# Patient Record
Sex: Male | Born: 1985 | Race: White | Hispanic: No | Marital: Single | Smoking: Never smoker
Health system: Southern US, Community
[De-identification: ages and names within clinical notes are randomized; demographics above are authoritative.]

## PROBLEM LIST (undated history)

## (undated) DIAGNOSIS — Z789 Other specified health status: Secondary | ICD-10-CM

## (undated) HISTORY — DX: Other specified health status: Z78.9

---
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
## 2020-02-25 ENCOUNTER — Other Ambulatory Visit: Payer: Self-pay

## 2020-02-25 ENCOUNTER — Emergency Department (HOSPITAL_BASED_OUTPATIENT_CLINIC_OR_DEPARTMENT_OTHER)
Admission: EM | Admit: 2020-02-25 | Discharge: 2020-02-25 | Disposition: A | Discharge status: Home / Self Care | Payer: 59 | Attending: Emergency Medicine | Admitting: Emergency Medicine

## 2020-02-25 ENCOUNTER — Encounter (HOSPITAL_BASED_OUTPATIENT_CLINIC_OR_DEPARTMENT_OTHER): Payer: Self-pay | Admitting: Emergency Medicine

## 2020-02-25 DIAGNOSIS — U071 COVID-19: Secondary | ICD-10-CM | POA: Insufficient documentation

## 2020-02-25 DIAGNOSIS — R1013 Epigastric pain: Secondary | ICD-10-CM | POA: Insufficient documentation

## 2020-02-25 LAB — COMPREHENSIVE METABOLIC PANEL
ALT: 32 U/L (ref 0–44)
AST: 34 U/L (ref 15–41)
Albumin: 4 g/dL (ref 3.5–5.0)
Alkaline Phosphatase: 52 U/L (ref 38–126)
Anion gap: 10 (ref 5–15)
BUN: 19 mg/dL (ref 6–20)
CO2: 25 mmol/L (ref 22–32)
Calcium: 8.6 mg/dL — ABNORMAL LOW (ref 8.9–10.3)
Chloride: 102 mmol/L (ref 98–111)
Creatinine, Ser: 1.27 mg/dL — ABNORMAL HIGH (ref 0.61–1.24)
GFR calc Af Amer: >60 mL/min (ref >60)
GFR calc non Af Amer: >60 mL/min (ref >60)
Glucose, Bld: 90 mg/dL (ref 70–99)
Potassium: 4.2 mmol/L (ref 3.5–5.1)
Sodium: 137 mmol/L (ref 135–145)
Total Bilirubin: 0.6 mg/dL (ref 0.3–1.2)
Total Protein: 6.8 g/dL (ref 6.5–8.1)

## 2020-02-25 LAB — CBC
HCT: 40.5 % (ref 39.0–52.0)
Hemoglobin: 13.5 g/dL (ref 13.0–17.0)
MCH: 30.1 pg (ref 26.0–34.0)
MCHC: 33.3 g/dL (ref 30.0–36.0)
MCV: 90.4 fL (ref 80.0–100.0)
Platelets: 94 10*3/uL — ABNORMAL LOW (ref 150–400)
RBC: 4.48 MIL/uL (ref 4.22–5.81)
RDW: 11.8 % (ref 11.5–15.5)
WBC: 3.1 10*3/uL — ABNORMAL LOW (ref 4.0–10.5)
nRBC: 0 % (ref 0.0–0.2)

## 2020-02-25 LAB — URINALYSIS, ROUTINE W REFLEX MICROSCOPIC
Bilirubin Urine: NEGATIVE
Glucose, UA: NEGATIVE mg/dL
Hgb urine dipstick: NEGATIVE
Ketones, ur: NEGATIVE mg/dL
Leukocytes,Ua: NEGATIVE
Nitrite: NEGATIVE
Protein, ur: NEGATIVE mg/dL
Specific Gravity, Urine: 1.025 (ref 1.005–1.030)
pH: 6 (ref 5.0–8.0)

## 2020-02-25 LAB — LIPASE, BLOOD: Lipase: 30 U/L (ref 11–51)

## 2020-02-25 MED ORDER — BENZONATATE 100 MG PO CAPS: 100.0000 mg | ORAL_CAPSULE | Freq: Three times a day (TID) | ORAL | 0 refills | Status: DC | PRN

## 2020-02-25 MED ORDER — ONDANSETRON 4 MG PO TBDP
8.0000 mg | ORAL_TABLET | Freq: Once | ORAL | Status: AC
  Administered 2020-02-25: 8 mg via ORAL
  Filled 2020-02-25: qty 2

## 2020-02-25 MED ORDER — ONDANSETRON 8 MG PO TBDP: 8.0000 mg | ORAL_TABLET | Freq: Three times a day (TID) | ORAL | 0 refills | Status: DC | PRN

## 2020-02-25 NOTE — ED Triage Notes (Signed)
Pt states he tested positive for Covid last Tuesday  Pt states since then he feel better one day and then sick again the next  Pt states he had vomiting this morning

## 2020-02-25 NOTE — Discharge Instructions (Addendum)
Take Tylenol as needed for aches and pains.  Take Zofran as needed for nausea.  Tessalon is to help you with coughing.  Monitor for shortness of breath persistent high fevers or other concerning symptoms

## 2020-02-25 NOTE — ED Provider Notes (Signed)
[facility] EMERGENCY DEPARTMENT Provider Note   CSN: [id number] Arrival date & time: 02/25/20  5621     History Chief Complaint  Patient presents with  . Covid Positive    Cameron Cox is a 34 y.o. male.  HPI   Patient states he was diagnosed with Covid last Thursday.  Since then he has been feeling sick.  No high fevers.  Patient has not been feeling short of breath.  Patient has not been vaccinated.  This morning the patient started having pain in his epigastric region.  He did have one episode of nausea and vomiting.  No diarrhea. History reviewed. No pertinent past medical history.  There are no problems to display for this patient.   History reviewed. No pertinent surgical history.     Family History  Problem Relation Age of Onset  . Hypertension Mother     Social History   Tobacco Use  . Smoking status: Never Smoker  . Smokeless tobacco: Never Used  Vaping Use  . Vaping Use: Never used  Substance Use Topics  . Alcohol use: Not Currently  . Drug use: Never    Home Medications Prior to Admission medications   Medication Sig Start Date End Date Taking? Authorizing Provider  benzonatate (TESSALON) 100 MG capsule Take 1 capsule (100 mg total) by mouth 3 (three) times daily as needed for cough. 02/25/20   Linwood Dibbles, MD  ondansetron (ZOFRAN ODT) 8 MG disintegrating tablet Take 1 tablet (8 mg total) by mouth every 8 (eight) hours as needed for nausea or vomiting. 02/25/20   Linwood Dibbles, MD    Allergies    Patient has no known allergies.  Review of Systems   Review of Systems  All other systems reviewed and are negative.   Physical Exam Updated Vital Signs BP 117/73 (BP Location: Right Arm)   Pulse 88   Temp 98.5 F (36.9 C) (Oral)   Resp 16   Ht 1.702 m (5\' 7" )   Wt 78.9 kg   SpO2 98%   BMI 27.25 kg/m   Physical Exam Vitals and nursing note reviewed.  Constitutional:      General: He is not in acute distress.    Appearance: He is  well-developed.  HENT:     Head: Normocephalic and atraumatic.     Right Ear: External ear normal.     Left Ear: External ear normal.  Eyes:     General: No scleral icterus.       Right eye: No discharge.        Left eye: No discharge.     Conjunctiva/sclera: Conjunctivae normal.  Neck:     Trachea: No tracheal deviation.  Cardiovascular:     Rate and Rhythm: Normal rate and regular rhythm.  Pulmonary:     Effort: Pulmonary effort is normal. No respiratory distress.     Breath sounds: Normal breath sounds. No stridor. No wheezing or rales.  Abdominal:     General: Bowel sounds are normal. There is no distension.     Palpations: Abdomen is soft.     Tenderness: There is abdominal tenderness in the epigastric area. There is no guarding or rebound.     Hernia: No hernia is present.  Musculoskeletal:        General: No tenderness.     Cervical back: Neck supple.  Skin:    General: Skin is warm and dry.     Findings: No rash.  Neurological:  Mental Status: He is alert.     Cranial Nerves: No cranial nerve deficit (no facial droop, extraocular movements intact, no slurred speech).     Sensory: No sensory deficit.     Motor: No abnormal muscle tone or seizure activity.     Coordination: Coordination normal.     ED Results / Procedures / Treatments   Labs (all labs ordered are listed, but only abnormal results are displayed) Labs Reviewed  CBC - Abnormal; Notable for the following components:      Result Value   WBC 3.1 (*)    Platelets 94 (*)    All other components within normal limits  COMPREHENSIVE METABOLIC PANEL - Abnormal; Notable for the following components:   Creatinine, Ser 1.27 (*)    Calcium 8.6 (*)    All other components within normal limits  LIPASE, BLOOD  URINALYSIS, ROUTINE W REFLEX MICROSCOPIC    EKG None  Radiology No results found.  Procedures Procedures (including critical care time)  Medications Ordered in ED Medications  ondansetron  (ZOFRAN-ODT) disintegrating tablet 8 mg (8 mg Oral Given 02/25/20 0809)    ED Course  I have reviewed the triage vital signs and the nursing notes.  Pertinent labs & imaging results that were available during my care of the patient were reviewed by me and considered in my medical decision making (see chart for details).  Clinical Course as of Feb 25 931  Tue Feb 25, 2020  0917 Laboratory tests normal   [JK]    Clinical Course User Index [JK] Linwood Dibbles, MD   MDM Rules/Calculators/A&P                          Patient presented to the ED for evaluation of abdominal pain.  Patient has known Covid infection.  Exam is reassuring.  Laboratory tests are unremarkable.  Doubt symptoms are related to pancreatitis cholecystitis bowel obstruction or other emergent etiology.  I suspect his symptoms are related to Covid.  Patient does not have an oxygen requirement.  His vitals are so normal.  No Covid risk factors.  Patient is stable for discharge.  Warning signs precautions discussed. Final Clinical Impression(s) / ED Diagnoses Final diagnoses:  COVID-19 virus infection  Epigastric pain    Rx / DC Orders ED Discharge Orders         Ordered    ondansetron (ZOFRAN ODT) 8 MG disintegrating tablet  Every 8 hours PRN        02/25/20 0931    benzonatate (TESSALON) 100 MG capsule  3 times daily PRN        02/25/20 0931           Linwood Dibbles, MD 02/25/20 [id number]

## 2021-02-23 ENCOUNTER — Ambulatory Visit (HOSPITAL_BASED_OUTPATIENT_CLINIC_OR_DEPARTMENT_OTHER)
Admission: EM | Admit: 2021-02-23 | Discharge: 2021-02-23 | Disposition: A | Discharge status: Home / Self Care | Payer: 59 | Attending: Surgery | Admitting: Surgery

## 2021-02-23 ENCOUNTER — Emergency Department (HOSPITAL_BASED_OUTPATIENT_CLINIC_OR_DEPARTMENT_OTHER): Payer: 59

## 2021-02-23 ENCOUNTER — Encounter (HOSPITAL_BASED_OUTPATIENT_CLINIC_OR_DEPARTMENT_OTHER): Payer: Self-pay | Admitting: Emergency Medicine

## 2021-02-23 ENCOUNTER — Encounter (HOSPITAL_COMMUNITY)
Admission: EM | Disposition: A | Discharge status: Home / Self Care | Payer: Self-pay | Source: Home / Self Care | Attending: Emergency Medicine

## 2021-02-23 ENCOUNTER — Other Ambulatory Visit: Payer: Self-pay

## 2021-02-23 ENCOUNTER — Emergency Department (HOSPITAL_COMMUNITY): Payer: 59 | Admitting: Anesthesiology

## 2021-02-23 DIAGNOSIS — K353 Acute appendicitis with localized peritonitis, without perforation or gangrene: Secondary | ICD-10-CM | POA: Insufficient documentation

## 2021-02-23 DIAGNOSIS — Z20822 Contact with and (suspected) exposure to covid-19: Secondary | ICD-10-CM | POA: Diagnosis not present

## 2021-02-23 HISTORY — PX: LAPAROSCOPIC APPENDECTOMY: SHX408

## 2021-02-23 LAB — COMPREHENSIVE METABOLIC PANEL
ALT: 22 U/L (ref 0–44)
AST: 21 U/L (ref 15–41)
Albumin: 4.1 g/dL (ref 3.5–5.0)
Alkaline Phosphatase: 68 U/L (ref 38–126)
Anion gap: 7 (ref 5–15)
BUN: 23 mg/dL — ABNORMAL HIGH (ref 6–20)
CO2: 27 mmol/L (ref 22–32)
Calcium: 8.9 mg/dL (ref 8.9–10.3)
Chloride: 105 mmol/L (ref 98–111)
Creatinine, Ser: 1.19 mg/dL (ref 0.61–1.24)
GFR, Estimated: >60 mL/min (ref >60)
Glucose, Bld: 91 mg/dL (ref 70–99)
Potassium: 3.9 mmol/L (ref 3.5–5.1)
Sodium: 139 mmol/L (ref 135–145)
Total Bilirubin: 0.5 mg/dL (ref 0.3–1.2)
Total Protein: 7.6 g/dL (ref 6.5–8.1)

## 2021-02-23 LAB — LIPASE, BLOOD: Lipase: 23 U/L (ref 11–51)

## 2021-02-23 LAB — CBC WITH DIFFERENTIAL/PLATELET
Abs Immature Granulocytes: 0.01 10*3/uL (ref 0.00–0.07)
Basophils Absolute: 0 10*3/uL (ref 0.0–0.1)
Basophils Relative: 0 %
Eosinophils Absolute: 0.1 10*3/uL (ref 0.0–0.5)
Eosinophils Relative: 2 %
HCT: 40 % (ref 39.0–52.0)
Hemoglobin: 13.3 g/dL (ref 13.0–17.0)
Immature Granulocytes: 0 %
Lymphocytes Relative: 31 %
Lymphs Abs: 1.7 10*3/uL (ref 0.7–4.0)
MCH: 30.2 pg (ref 26.0–34.0)
MCHC: 33.3 g/dL (ref 30.0–36.0)
MCV: 90.7 fL (ref 80.0–100.0)
Monocytes Absolute: 0.5 10*3/uL (ref 0.1–1.0)
Monocytes Relative: 10 %
Neutro Abs: 3.3 10*3/uL (ref 1.7–7.7)
Neutrophils Relative %: 57 %
Platelets: 182 10*3/uL (ref 150–400)
RBC: 4.41 MIL/uL (ref 4.22–5.81)
RDW: 12.1 % (ref 11.5–15.5)
WBC: 5.7 10*3/uL (ref 4.0–10.5)
nRBC: 0 % (ref 0.0–0.2)

## 2021-02-23 LAB — URINALYSIS, ROUTINE W REFLEX MICROSCOPIC
Bilirubin Urine: NEGATIVE
Glucose, UA: NEGATIVE mg/dL
Ketones, ur: NEGATIVE mg/dL
Leukocytes,Ua: NEGATIVE
Nitrite: NEGATIVE
Protein, ur: NEGATIVE mg/dL
Specific Gravity, Urine: 1.03 (ref 1.005–1.030)
pH: 6 (ref 5.0–8.0)

## 2021-02-23 LAB — URINALYSIS, MICROSCOPIC (REFLEX)

## 2021-02-23 LAB — RESP PANEL BY RT-PCR (FLU A&B, COVID) ARPGX2
Influenza A by PCR: NEGATIVE
Influenza B by PCR: NEGATIVE
SARS Coronavirus 2 by RT PCR: NEGATIVE

## 2021-02-23 LAB — CK: Total CK: 150 U/L (ref 49–397)

## 2021-02-23 SURGERY — APPENDECTOMY, LAPAROSCOPIC
Anesthesia: General | Site: Abdomen

## 2021-02-23 MED ORDER — PROPOFOL 10 MG/ML IV BOLUS
INTRAVENOUS | Status: DC | PRN
  Administered 2021-02-23: 200 mg via INTRAVENOUS

## 2021-02-23 MED ORDER — ONDANSETRON HCL 4 MG/2ML IJ SOLN
INTRAMUSCULAR | Status: DC | PRN
  Administered 2021-02-23: 4 mg via INTRAVENOUS

## 2021-02-23 MED ORDER — IOHEXOL 350 MG/ML SOLN
100.0000 mL | Freq: Once | INTRAVENOUS | Status: AC | PRN
  Administered 2021-02-23: 85 mL via INTRAVENOUS

## 2021-02-23 MED ORDER — ACETAMINOPHEN 10 MG/ML IV SOLN: 1000.0000 mg | Freq: Once | INTRAVENOUS | Status: DC | PRN

## 2021-02-23 MED ORDER — ONDANSETRON HCL 4 MG/2ML IJ SOLN
INTRAMUSCULAR | Status: AC
  Filled 2021-02-23: qty 4

## 2021-02-23 MED ORDER — METRONIDAZOLE 500 MG/100ML IV SOLN
500.0000 mg | Freq: Once | INTRAVENOUS | Status: AC
  Administered 2021-02-23: 500 mg via INTRAVENOUS
  Filled 2021-02-23: qty 100

## 2021-02-23 MED ORDER — ROCURONIUM BROMIDE 10 MG/ML (PF) SYRINGE
PREFILLED_SYRINGE | INTRAVENOUS | Status: DC | PRN
  Administered 2021-02-23: 50 mg via INTRAVENOUS

## 2021-02-23 MED ORDER — MIDAZOLAM HCL 2 MG/2ML IJ SOLN
INTRAMUSCULAR | Status: AC
  Filled 2021-02-23: qty 2

## 2021-02-23 MED ORDER — SUGAMMADEX SODIUM 200 MG/2ML IV SOLN
INTRAVENOUS | Status: DC | PRN
  Administered 2021-02-23: 200 mg via INTRAVENOUS

## 2021-02-23 MED ORDER — SODIUM CHLORIDE 0.9 % IV SOLN
2.0000 g | Freq: Once | INTRAVENOUS | Status: AC
  Administered 2021-02-23: 2 g via INTRAVENOUS
  Filled 2021-02-23: qty 20

## 2021-02-23 MED ORDER — ACETAMINOPHEN 325 MG PO TABS: 650.0000 mg | ORAL_TABLET | Freq: Four times a day (QID) | ORAL | Status: AC | PRN

## 2021-02-23 MED ORDER — PROPOFOL 10 MG/ML IV BOLUS
INTRAVENOUS | Status: AC
  Filled 2021-02-23: qty 20

## 2021-02-23 MED ORDER — BUPIVACAINE-EPINEPHRINE (PF) 0.5% -1:200000 IJ SOLN
INTRAMUSCULAR | Status: DC | PRN
Start: 2021-02-23 — End: 2021-02-23
  Administered 2021-02-23: 30 mL via PERINEURAL

## 2021-02-23 MED ORDER — FENTANYL CITRATE PF 50 MCG/ML IJ SOSY: 25.0000 ug | PREFILLED_SYRINGE | INTRAMUSCULAR | Status: DC | PRN

## 2021-02-23 MED ORDER — FENTANYL CITRATE (PF) 250 MCG/5ML IJ SOLN
INTRAMUSCULAR | Status: AC
  Filled 2021-02-23: qty 5

## 2021-02-23 MED ORDER — OXYCODONE HCL 5 MG PO TABS: 5.0000 mg | ORAL_TABLET | Freq: Four times a day (QID) | ORAL | 0 refills | Status: DC | PRN

## 2021-02-23 MED ORDER — LACTATED RINGERS IV SOLN: INTRAVENOUS | Status: DC | PRN

## 2021-02-23 MED ORDER — SUCCINYLCHOLINE CHLORIDE 200 MG/10ML IV SOSY
PREFILLED_SYRINGE | INTRAVENOUS | Status: DC | PRN
  Administered 2021-02-23: 120 mg via INTRAVENOUS

## 2021-02-23 MED ORDER — DEXMEDETOMIDINE (PRECEDEX) IN NS 20 MCG/5ML (4 MCG/ML) IV SYRINGE
PREFILLED_SYRINGE | INTRAVENOUS | Status: DC | PRN
  Administered 2021-02-23: 12 ug via INTRAVENOUS

## 2021-02-23 MED ORDER — FENTANYL CITRATE (PF) 100 MCG/2ML IJ SOLN
INTRAMUSCULAR | Status: DC | PRN
  Administered 2021-02-23: 100 ug via INTRAVENOUS
  Administered 2021-02-23: 50 ug via INTRAVENOUS

## 2021-02-23 MED ORDER — LIDOCAINE 2% (20 MG/ML) 5 ML SYRINGE
INTRAMUSCULAR | Status: DC | PRN
  Administered 2021-02-23: 60 mg via INTRAVENOUS

## 2021-02-23 MED ORDER — IBUPROFEN 200 MG PO TABS: 400.0000 mg | ORAL_TABLET | Freq: Four times a day (QID) | ORAL | Status: DC | PRN

## 2021-02-23 MED ORDER — OXYCODONE HCL 5 MG PO TABS: 5.0000 mg | ORAL_TABLET | Freq: Once | ORAL | Status: AC

## 2021-02-23 MED ORDER — SODIUM CHLORIDE 0.9 % IV SOLN
INTRAVENOUS | Status: DC | PRN
  Administered 2021-02-23: 500 mL via INTRAVENOUS

## 2021-02-23 MED ORDER — KETAMINE HCL 10 MG/ML IJ SOLN
INTRAMUSCULAR | Status: DC | PRN
  Administered 2021-02-23: 40 mg via INTRAVENOUS

## 2021-02-23 MED ORDER — 0.9 % SODIUM CHLORIDE (POUR BTL) OPTIME
TOPICAL | Status: DC | PRN
  Administered 2021-02-23: 1000 mL

## 2021-02-23 MED ORDER — DEXAMETHASONE SODIUM PHOSPHATE 10 MG/ML IJ SOLN
INTRAMUSCULAR | Status: DC | PRN
  Administered 2021-02-23: 8 mg via INTRAVENOUS

## 2021-02-23 MED ORDER — SODIUM CHLORIDE 0.9 % IV BOLUS
1000.0000 mL | Freq: Once | INTRAVENOUS | Status: AC
  Administered 2021-02-23: 1000 mL via INTRAVENOUS

## 2021-02-23 MED ORDER — OXYCODONE HCL 5 MG PO TABS
ORAL_TABLET | ORAL | Status: AC
  Administered 2021-02-23: 5 mg via ORAL
  Filled 2021-02-23: qty 1

## 2021-02-23 MED ORDER — MIDAZOLAM HCL 5 MG/5ML IJ SOLN
INTRAMUSCULAR | Status: DC | PRN
  Administered 2021-02-23: 2 mg via INTRAVENOUS

## 2021-02-23 MED ORDER — PROMETHAZINE HCL 25 MG/ML IJ SOLN: 6.2500 mg | INTRAMUSCULAR | Status: DC | PRN

## 2021-02-23 MED ORDER — DEXAMETHASONE SODIUM PHOSPHATE 10 MG/ML IJ SOLN
INTRAMUSCULAR | Status: AC
  Filled 2021-02-23: qty 2

## 2021-02-23 SURGICAL SUPPLY — 55 items
ADH SKN CLS APL DERMABOND .7 (GAUZE/BANDAGES/DRESSINGS) ×1
APL PRP STRL LF DISP 70% ISPRP (MISCELLANEOUS) ×1
APPLIER CLIP 5 13 M/L LIGAMAX5 (MISCELLANEOUS)
APPLIER CLIP ROT 10 11.4 M/L (STAPLE)
APR CLP MED LRG 11.4X10 (STAPLE)
APR CLP MED LRG 5 ANG JAW (MISCELLANEOUS)
BAG COUNTER SPONGE SURGICOUNT (BAG) IMPLANT
BAG SPEC RTRVL LRG 6X4 10 (ENDOMECHANICALS) ×1
BAG SPNG CNTER NS LX DISP (BAG)
CABLE HIGH FREQUENCY MONO STRZ (ELECTRODE) ×1 IMPLANT
CHLORAPREP W/TINT 26 (MISCELLANEOUS) ×2 IMPLANT
CLIP APPLIE 5 13 M/L LIGAMAX5 (MISCELLANEOUS) IMPLANT
CLIP APPLIE ROT 10 11.4 M/L (STAPLE) IMPLANT
COVER SURGICAL LIGHT HANDLE (MISCELLANEOUS) ×2 IMPLANT
CUTTER FLEX LINEAR 45M (STAPLE) ×2 IMPLANT
DECANTER SPIKE VIAL GLASS SM (MISCELLANEOUS) ×2 IMPLANT
DERMABOND ADVANCED (GAUZE/BANDAGES/DRESSINGS) ×1
DERMABOND ADVANCED .7 DNX12 (GAUZE/BANDAGES/DRESSINGS) ×1 IMPLANT
DRAIN CHANNEL 19F RND (DRAIN) IMPLANT
ELECT REM PT RETURN 15FT ADLT (MISCELLANEOUS) ×2 IMPLANT
ENDOLOOP SUT PDS II  0 18 (SUTURE)
ENDOLOOP SUT PDS II 0 18 (SUTURE) IMPLANT
EVACUATOR SILICONE 100CC (DRAIN) IMPLANT
GLOVE SURG ENC MOIS LTX SZ6 (GLOVE) ×2 IMPLANT
GLOVE SURG UNDER LTX SZ6.5 (GLOVE) ×2 IMPLANT
GOWN STRL REUS W/ TWL LRG LVL3 (GOWN DISPOSABLE) ×1 IMPLANT
GOWN STRL REUS W/TWL LRG LVL3 (GOWN DISPOSABLE) ×2
GOWN STRL REUS W/TWL XL LVL3 (GOWN DISPOSABLE) ×2 IMPLANT
GRASPER SUT TROCAR 14GX15 (MISCELLANEOUS) IMPLANT
IRRIG SUCT STRYKERFLOW 2 WTIP (MISCELLANEOUS) ×2
IRRIGATION SUCT STRKRFLW 2 WTP (MISCELLANEOUS) ×1 IMPLANT
KIT BASIN OR (CUSTOM PROCEDURE TRAY) ×2 IMPLANT
KIT TURNOVER KIT A (KITS) ×2 IMPLANT
NDL INSUFFLATION 14GA 120MM (NEEDLE) IMPLANT
NEEDLE INSUFFLATION 14GA 120MM (NEEDLE) ×2 IMPLANT
PENCIL SMOKE EVACUATOR (MISCELLANEOUS) ×2 IMPLANT
POUCH SPECIMEN RETRIEVAL 10MM (ENDOMECHANICALS) ×2 IMPLANT
RELOAD 45 VASCULAR/THIN (ENDOMECHANICALS) IMPLANT
RELOAD STAPLE 45 2.5 WHT GRN (ENDOMECHANICALS) IMPLANT
RELOAD STAPLE 45 3.5 BLU ETS (ENDOMECHANICALS) IMPLANT
RELOAD STAPLE TA45 3.5 REG BLU (ENDOMECHANICALS) ×2 IMPLANT
SCISSORS LAP 5X35 DISP (ENDOMECHANICALS) ×1 IMPLANT
SET TUBE SMOKE EVAC HIGH FLOW (TUBING) ×2 IMPLANT
SHEARS HARMONIC ACE PLUS 36CM (ENDOMECHANICALS) ×1 IMPLANT
SLEEVE XCEL OPT CAN 5 100 (ENDOMECHANICALS) ×2 IMPLANT
SUT ETHILON 2 0 PS N (SUTURE) IMPLANT
SUT MNCRL AB 4-0 PS2 18 (SUTURE) ×2 IMPLANT
TOWEL OR 17X26 10 PK STRL BLUE (TOWEL DISPOSABLE) ×2 IMPLANT
TOWEL OR NON WOVEN STRL DISP B (DISPOSABLE) ×2 IMPLANT
TRAY FOLEY MTR SLVR 14FR STAT (SET/KITS/TRAYS/PACK) IMPLANT
TRAY FOLEY MTR SLVR 16FR STAT (SET/KITS/TRAYS/PACK) IMPLANT
TRAY LAPAROSCOPIC (CUSTOM PROCEDURE TRAY) ×2 IMPLANT
TROCAR BLADELESS OPT 5 100 (ENDOMECHANICALS) ×2 IMPLANT
TROCAR XCEL 12X100 BLDLESS (ENDOMECHANICALS) IMPLANT
TROCAR XCEL BLUNT TIP 100MML (ENDOMECHANICALS) ×2 IMPLANT

## 2021-02-23 NOTE — Anesthesia Procedure Notes (Signed)
Procedure Name: Intubation Date/Time: 02/23/2021 2:11 PM Performed by: Sudie Grumbling, CRNA Pre-anesthesia Checklist: Patient identified, Emergency Drugs available, Suction available and Patient being monitored Patient Re-evaluated:Patient Re-evaluated prior to induction Oxygen Delivery Method: Circle system utilized Preoxygenation: Pre-oxygenation with 100% oxygen Induction Type: IV induction Laryngoscope Size: Miller and 2 Grade View: Grade I Tube type: Oral Number of attempts: 1 Airway Equipment and Method: Stylet Placement Confirmation: ETT inserted through vocal cords under direct vision, positive ETCO2 and breath sounds checked- equal and bilateral Tube secured with: Tape Dental Injury: Teeth and Oropharynx as per pre-operative assessment

## 2021-02-23 NOTE — Op Note (Signed)
Date: 02/23/21  Patient: Cameron Cox MRN: [id number]  Preoperative Diagnosis: Acute appendicitis Postoperative Diagnosis: Same  Procedure: Laparoscopic appendectomy  Surgeon: Sophronia Simas, MD  EBL: Minimal  Anesthesia: General endotracheal  Specimens: Appendix  Indications: Mr. Renz is a 35 yo male who presented with 4 days of RLQ abdominal pain. CT scan showed acute appendicitis without perforation. He consented to proceed with laparoscopic appendectomy.  Findings: Acute appendicitis, no abscess or perforation.  Procedure details: Informed consent was obtained in the preoperative area prior to the procedure. The patient was brought to the operating room and placed on the table in the supine position. General anesthesia was induced and appropriate lines and drains were placed for intraoperative monitoring. Perioperative antibiotics were administered per SCIP guidelines. The abdomen was prepped and draped in the usual sterile fashion. A pre-procedure timeout was taken verifying patient identity, surgical site and procedure to be performed.  A small infraumbilical skin incision was made and the subcutaneous tissue was spread to expose the fascia. The umbilical stalk was grasped and elevated, and the fascia was sharply incised. The peritoneal cavity was visualized and a 39mm Hasson trocar was inserted. The abdomen was inspected with no evidence of visceral or vascular injury. A suprapubic 31mm port was placed, taking care to avoid injury to the bladder, followed by a 59mm port in the LLQ, both under direct visualization. The cecum and terminal ileum were mildly adherent to the right lateral abdominal wall, and were bluntly taken down of the abdominal wall.  The appendix was then visualized and was inflamed and dilated, but there was no abscess or perforation.  A mesenteric window was bluntly created at the base of the appendix, and the appendix was divided at the base from the cecum using a  70mm stapler with a blue load. The mesoappendix was then divided with the harmonic, taking care to avoid thermal injury to the cecum. The specimen was placed in an endocatch bag and removed and sent for routine pathology. The surgical site was irrigated. The staple line was inspected and appeared in tact with no bleeding or leakage. The ports were removed and the pneumoperitoneum was evacuated. The umbilical port site fascia was closed with an 0 Vicryl suture. The skin at all port sites was closed with 4-0 monocryl subcuticular suture. Dermabond was applied.  Upon entering the abdomen (organ space), I encountered infection of the appendix .  CASE DATA:  Type of patient?: LDOW CASE (Surgical Hospitalist [facility] Inpatient)  Status of Case? URGENT Add On  Infection Present At Time Of Surgery (PATOS)?  INFECTION of the appendix    All counts were correct x2 at the end of the procedure. The patient was extubated and taken to PACU in stable condition.  Sophronia Simas, MD 02/23/21 3:18 PM

## 2021-02-23 NOTE — H&P (Signed)
Admission Note  Cameron Cox 1986-02-11  [id number].    Requesting MD: Pricilla Loveless, MD Chief Complaint/Reason for Consult: Acute appendicitis  HPI:  Patient is a 35 year old male who presented to [facility] this AM with RLQ pain. Pain started as upper abdominal and chest pain 9/16 with associated nausea and vomiting, and constipation. Since onset, pain has been constant and localizing to R abdomen. Sometimes improved by lying on right side and worsened by lying on left side. Denies fever, chills, urinary symptoms, diarrhea. Patient denies other PMH and no prior abdominal surgery. He is not on any blood thinners. NKDA. He works for the city of [place]. Reports occasional alcohol and marijuana use and denies tobacco use. He lives with his fiance.   ROS: Review of Systems  Constitutional:  Negative for chills and fever.  Respiratory:  Negative for cough, shortness of breath and wheezing.   Cardiovascular:  Positive for chest pain. Negative for palpitations and leg swelling.  Gastrointestinal:  Positive for abdominal pain, constipation, nausea and vomiting. Negative for diarrhea.  Genitourinary:  Negative for dysuria, frequency and urgency.  Musculoskeletal:  Negative for back pain and myalgias.  All other systems reviewed and are negative.  Family History  Problem Relation Age of Onset   Hypertension Mother     History reviewed. No pertinent past medical history.  History reviewed. No pertinent surgical history.  Social History:  reports that he has never smoked. He has never used smokeless tobacco. He reports that he does not currently use alcohol. He reports that he does not use drugs.  Allergies: No Known Allergies  (Not in a hospital admission)   Blood pressure (!) 123/96, pulse 74, temperature 98 F (36.7 C), temperature source Oral, resp. rate 16, height 5\' 7"  (1.702 m), weight 80.3 kg, SpO2 100 %. Physical Exam:  General: pleasant, WD, WN male who is laying in  bed in NAD HEENT: head is normocephalic, atraumatic.  Sclera are noninjected.  PERRL.  Ears and nose without any masses or lesions.  Mouth is pink and moist Heart: regular, rate, and rhythm.  Normal s1,s2. No obvious murmurs, gallops, or rubs noted.  Palpable radial and pedal pulses bilaterally Lungs: CTAB, no wheezes, rhonchi, or rales noted.  Respiratory effort nonlabored Abd: soft, mildly ttp in R abdomen without peritonitis, ND, +BS, no masses, hernias, or organomegaly MS: all 4 extremities are symmetrical with no cyanosis, clubbing, or edema. Skin: warm and dry with no masses, lesions, or rashes Neuro: Cranial nerves 2-12 grossly intact, sensation is normal throughout Psych: A&Ox3 with an appropriate affect.   Results for orders placed or performed during the hospital encounter of 02/23/21 (from the past 48 hour(s))  Urinalysis, Routine w reflex microscopic Urine, Clean Catch     Status: Abnormal   Collection Time: 02/23/21  6:27 AM  Result Value Ref Range   Color, Urine YELLOW YELLOW   APPearance CLEAR CLEAR   Specific Gravity, Urine 1.030 1.005 - 1.030   pH 6.0 5.0 - 8.0   Glucose, UA NEGATIVE NEGATIVE mg/dL   Hgb urine dipstick MODERATE (A) NEGATIVE   Bilirubin Urine NEGATIVE NEGATIVE   Ketones, ur NEGATIVE NEGATIVE mg/dL   Protein, ur NEGATIVE NEGATIVE mg/dL   Nitrite NEGATIVE NEGATIVE   Leukocytes,Ua NEGATIVE NEGATIVE    Comment: Performed at [facility], [address] [place], [place]  Urinalysis, Microscopic (reflex)     Status: Abnormal   Collection Time: 02/23/21  6:27  AM  Result Value Ref Range   RBC / HPF 0-5 0 - 5 RBC/hpf   WBC, UA 0-5 0 - 5 WBC/hpf   Bacteria, UA RARE (A) NONE SEEN   Squamous Epithelial / LPF 0-5 0 - 5   Mucus PRESENT     Comment: Performed at [facility], [address] [place], [place] [address]  Comprehensive metabolic panel     Status: Abnormal   Collection Time: 02/23/21  8:00 AM  Result Value Ref Range    Sodium 139 135 - 145 mmol/L   Potassium 3.9 3.5 - 5.1 mmol/L   Chloride 105 98 - 111 mmol/L   CO2 27 22 - 32 mmol/L   Glucose, Bld 91 70 - 99 mg/dL    Comment: Glucose reference range applies only to samples taken after fasting for at least 8 hours.   BUN 23 (H) 6 - 20 mg/dL   Creatinine, Ser 5.28 0.61 - 1.24 mg/dL   Calcium 8.9 8.9 - 41.3 mg/dL   Total Protein 7.6 6.5 - 8.1 g/dL   Albumin 4.1 3.5 - 5.0 g/dL   AST 21 15 - 41 U/L   ALT 22 0 - 44 U/L   Alkaline Phosphatase 68 38 - 126 U/L   Total Bilirubin 0.5 0.3 - 1.2 mg/dL   GFR, Estimated >24 >40 mL/min    Comment: (NOTE) Calculated using the CKD-EPI Creatinine Equation (2021)    Anion gap 7 5 - 15    Comment: Performed at [facility], [address] [place], [place] [address]  CBC with Differential     Status: None   Collection Time: 02/23/21  8:00 AM  Result Value Ref Range   WBC 5.7 4.0 - 10.5 K/uL   RBC 4.41 4.22 - 5.81 MIL/uL   Hemoglobin 13.3 13.0 - 17.0 g/dL   HCT 53.6 64.4 - 03.4 %   MCV 90.7 80.0 - 100.0 fL   MCH 30.2 26.0 - 34.0 pg   MCHC 33.3 30.0 - 36.0 g/dL   RDW 74.2 59.5 - 63.8 %   Platelets 182 150 - 400 K/uL   nRBC 0.0 0.0 - 0.2 %   Neutrophils Relative % 57 %   Neutro Abs 3.3 1.7 - 7.7 K/uL   Lymphocytes Relative 31 %   Lymphs Abs 1.7 0.7 - 4.0 K/uL   Monocytes Relative 10 %   Monocytes Absolute 0.5 0.1 - 1.0 K/uL   Eosinophils Relative 2 %   Eosinophils Absolute 0.1 0.0 - 0.5 K/uL   Basophils Relative 0 %   Basophils Absolute 0.0 0.0 - 0.1 K/uL   Immature Granulocytes 0 %   Abs Immature Granulocytes 0.01 0.00 - 0.07 K/uL    Comment: Performed at [facility], [address] [place], [place] [address]  Lipase, blood     Status: None   Collection Time: 02/23/21  8:00 AM  Result Value Ref Range   Lipase 23 11 - 51 U/L    Comment: Performed at [facility], [address] [place], [place] [address]  CK     Status: None   Collection Time: 02/23/21  8:00 AM   Result Value Ref Range   Total CK 150 49 - 397 U/L    Comment: Performed at [facility], [address] [place], [place] [address]  Resp Panel by RT-PCR (Flu A&B, Covid) Nasopharyngeal Swab     Status: None  Collection Time: 02/23/21  8:47 AM   Specimen: Nasopharyngeal Swab; Nasopharyngeal(NP) swabs in vial transport medium  Result Value Ref Range   SARS Coronavirus 2 by RT PCR NEGATIVE NEGATIVE    Comment: (NOTE) SARS-CoV-2 target nucleic acids are NOT DETECTED.  The SARS-CoV-2 RNA is generally detectable in upper respiratory specimens during the acute phase of infection. The lowest concentration of SARS-CoV-2 viral copies this assay can detect is 138 copies/mL. A negative result does not preclude SARS-Cov-2 infection and should not be used as the sole basis for treatment or other patient management decisions. A negative result may occur with  improper specimen collection/handling, submission of specimen other than nasopharyngeal swab, presence of viral mutation(s) within the areas targeted by this assay, and inadequate number of viral copies(<138 copies/mL). A negative result must be combined with clinical observations, patient history, and epidemiological information. The expected result is Negative.  Fact Sheet for Patients:  BloggerCourse.com  Fact Sheet for Healthcare Providers:  SeriousBroker.it  This test is no t yet approved or cleared by the [place] FDA and  has been authorized for detection and/or diagnosis of SARS-CoV-2 by FDA under an Emergency Use Authorization (EUA). This EUA will remain  in effect (meaning this test can be used) for the duration of the COVID-19 declaration under Section 564(b)(1) of the Act, 21 U.S.C.section 360bbb-3(b)(1), unless the authorization is terminated  or revoked sooner.       Influenza A by PCR NEGATIVE NEGATIVE   Influenza B by PCR NEGATIVE NEGATIVE     Comment: (NOTE) The Xpert Xpress SARS-CoV-2/FLU/RSV plus assay is intended as an aid in the diagnosis of influenza from Nasopharyngeal swab specimens and should not be used as a sole basis for treatment. Nasal washings and aspirates are unacceptable for Xpert Xpress SARS-CoV-2/FLU/RSV testing.  Fact Sheet for Patients: BloggerCourse.com  Fact Sheet for Healthcare Providers: SeriousBroker.it  This test is not yet approved or cleared by the [place] FDA and has been authorized for detection and/or diagnosis of SARS-CoV-2 by FDA under an Emergency Use Authorization (EUA). This EUA will remain in effect (meaning this test can be used) for the duration of the COVID-19 declaration under Section 564(b)(1) of the Act, 21 U.S.C. section 360bbb-3(b)(1), unless the authorization is terminated or revoked.  Performed at [facility], [address] [place], [place] [address]    CT ABDOMEN PELVIS W CONTRAST  Result Date: 02/23/2021 CLINICAL DATA:  Right lower quadrant abdominal pain. Concern for appendicitis. EXAM: CT ABDOMEN AND PELVIS WITH CONTRAST TECHNIQUE: Multidetector CT imaging of the abdomen and pelvis was performed using the standard protocol following bolus administration of intravenous contrast. CONTRAST:  109mL OMNIPAQUE IOHEXOL 350 MG/ML SOLN COMPARISON:  None. FINDINGS: Lower chest: Limited visualization of the lower thorax is negative for focal airspace opacity or pleural effusion. Normal heart size.  No pericardial effusion. Hepatobiliary: Normal hepatic contour. No discrete hepatic lesions. Normal appearance of the gallbladder given degree distention. No radiopaque gallstones. No intra or extrahepatic biliary duct dilatation. No ascites. Pancreas: Normal appearance of the pancreas. Spleen: Normal appearance of the spleen. Adrenals/Urinary Tract: There is symmetric enhancement of the bilateral kidneys. No definite evidence  of nephrolithiasis on this postcontrast examination. There is a punctate (approximately 3 mm) hypoattenuating lesion within the posterior interpolar aspect the right kidney (image 29, series 2), which is too small to adequately characterize though favored to represent a renal cyst. No discrete left-sided renal lesions. Normal appearance of the bilateral adrenal glands Normal appearance  of the urinary bladder given degree distention. Stomach/Bowel: The appendix is mildly dilated (measuring 1 cm in greatest oblique short axis coronal diameter-coronal image 44, series 5) with associated minimal wall thickening and periappendiceal stranding. No definitive at appendicolith is identified. No evidence of perforation or definable/drainable fluid collection. Moderate colonic stool burden without evidence of enteric obstruction. Small hiatal hernia. No pneumoperitoneum, pneumatosis or portal venous gas. Vascular/Lymphatic: Normal caliber of the abdominal aorta. The major branch vessels of the abdominal aorta appear patent on this non CTA examination. Scattered mildly prominent lymph nodes centered within the right lower abdominal quadrant are numerous though individually not enlarged by size criteria with index right lower quadrant mesenteric lymph node measuring 0.5 cm in greatest short axis diameter (image 37, series 2, presumably reactive in etiology. No bulky retroperitoneal, mesenteric, pelvic or inguinal lymphadenopathy. Reproductive: Normal appearance the prostate gland. No free fluid the pelvic cul-de-sac. Other: Regional soft tissues appear normal. Musculoskeletal: No acute or aggressive osseous abnormalities. There is partial lumbarization of S1. IMPRESSION: Examination is positive for acute uncomplicated appendicitis without evidence of an appendicolith, perforation or definable/drainable fluid collection. Critical Value/emergent results were called by telephone at the time of interpretation on 02/23/2021 at 8:35 am  to provider Pricilla Loveless , who verbally acknowledged these results. Electronically Signed   By: Simonne Come M.D.   On: 02/23/2021 08:35      Assessment/Plan Acute appendicitis  - CT today with mildly dilated appendix with minimal wall thickening and periappendiceal stranding, no appendicolith or perforation noted - no leukocytosis, no tachycardia and afebrile - history and exam consistent with acute appendicitis - plan for laparoscopic appendectomy in the OR today. Will potentially discharge from PACU post-operatively. If patient requires admission post-operatively, then he would be admitted to observation   Juliet Rude, [facility] 02/23/2021, 11:17 AM Please see Amion for pager number during day hours 7:00am-4:30pm

## 2021-02-23 NOTE — ED Triage Notes (Signed)
RUQ abd pain that started 3 days ago. Pt reports multiple episodes of vomiting x 3 days ago, no vomiting since then.

## 2021-02-23 NOTE — Transfer of Care (Signed)
Immediate Anesthesia Transfer of Care Note  Patient: Cameron Cox  Procedure(s) Performed: APPENDECTOMY LAPAROSCOPIC (Abdomen)  Patient Location: PACU  Anesthesia Type:General  Level of Consciousness: awake, alert  and oriented  Airway & Oxygen Therapy: Patient Spontanous Breathing and Patient connected to face mask  Post-op Assessment: Report given to RN and Post -op Vital signs reviewed and stable  Post vital signs: Reviewed and stable  Last Vitals:  Vitals Value Taken Time  BP 116/68 02/23/21 1530  Temp    Pulse 65 02/23/21 1532  Resp 19 02/23/21 1520  SpO2 100 % 02/23/21 1532  Vitals shown include unvalidated device data.  Last Pain:  Vitals:   02/23/21 1222  TempSrc: Oral  PainSc:          Complications: No notable events documented.

## 2021-02-23 NOTE — Discharge Instructions (Addendum)
[facility] LAPAROSCOPIC SURGERY: POST OP INSTRUCTIONS Always review your discharge instruction sheet given to you by the facility where your surgery was performed. IF YOU HAVE DISABILITY OR FAMILY LEAVE FORMS, YOU MUST BRING THEM TO THE OFFICE FOR PROCESSING.   DO NOT GIVE THEM TO YOUR DOCTOR.  PAIN CONTROL  First take acetaminophen (Tylenol) AND/or ibuprofen (Advil) to control your pain after surgery.  Follow directions on package.  Taking acetaminophen (Tylenol) and/or ibuprofen (Advil) regularly after surgery will help to control your pain and lower the amount of prescription pain medication you may need.  You should not take more than 3,000 mg (3 grams) of acetaminophen (Tylenol) in 24 hours.  You should not take ibuprofen (Advil), aleve, motrin, naprosyn or other NSAIDS if you have a history of stomach ulcers or chronic kidney disease.  A prescription for pain medication may be given to you upon discharge.  Take your pain medication as prescribed, if you still have uncontrolled pain after taking acetaminophen (Tylenol) or ibuprofen (Advil). Use ice packs to help control pain. If you need a refill on your pain medication, please contact your pharmacy.  They will contact our office to request authorization. Prescriptions will not be filled after 5pm or on week-ends.  HOME MEDICATIONS Take your usually prescribed medications unless otherwise directed.  DIET You should follow a light diet the first few days after arrival home.  Be sure to include lots of fluids daily. Avoid fatty, fried foods.   CONSTIPATION It is common to experience some constipation after surgery and if you are taking pain medication.  Increasing fluid intake and taking a stool softener (such as Colace) will usually help or prevent this problem from occurring.  A mild laxative (Milk of Magnesia or Miralax) should be taken according to package instructions if there are no bowel movements after 48  hours.  WOUND/INCISION CARE Most patients will experience some swelling and bruising in the area of the incisions.  Ice packs will help.  Swelling and bruising can take several days to resolve.  Unless discharge instructions indicate otherwise, follow guidelines below  STERI-STRIPS - you may remove your outer bandages 48 hours after surgery, and you may shower at that time.  You have steri-strips (small skin tapes) in place directly over the incision.  These strips should be left on the skin for 7-10 days.   DERMABOND/SKIN GLUE - you may shower in 24 hours.  The glue will flake off over the next 2-3 weeks. Any sutures or staples will be removed at the office during your follow-up visit.  ACTIVITIES You may resume regular (light) daily activities beginning the next day--such as daily self-care, walking, climbing stairs--gradually increasing activities as tolerated.  You may have sexual intercourse when it is comfortable.  Refrain from any heavy lifting or straining until approved by your doctor. You may drive when you are no longer taking prescription pain medication, you can comfortably wear a seatbelt, and you can safely maneuver your car and apply brakes.  FOLLOW-UP You should see your doctor in the office for a follow-up appointment approximately 2-3 weeks after your surgery.  You should have been given your post-op/follow-up appointment when your surgery was scheduled.  If you did not receive a post-op/follow-up appointment, make sure that you call for this appointment within a day or two after you arrive home to insure a convenient appointment time.   WHEN TO CALL YOUR DOCTOR: Fever over 101.0 Inability to urinate Continued bleeding from incision.   Increased pain, redness, or drainage from the incision. Increasing abdominal pain  The clinic staff is available to answer your questions during regular business hours.  Please don't hesitate to call and ask to speak to one of the nurses for  clinical concerns.  If you have a medical emergency, go to the nearest emergency room or call 911.  A surgeon from [facility] is always on call at the hospital. [address], [address], [place], [place]  [address] ? [address] [address], , [place]   [address] [phone] ? [phone] ? FAX [phone] Web site: [email or web address]      Managing Your Pain After Surgery Without Opioids    Thank you for participating in our program to help patients manage their pain after surgery without opioids. This is part of our effort to provide you with the best care possible, without exposing you or your family to the risk that opioids pose.  What pain can I expect after surgery? You can expect to have some pain after surgery. This is normal. The pain is typically worse the day after surgery, and quickly begins to get better. Many studies have found that many patients are able to manage their pain after surgery with Over-the-Counter (OTC) medications such as Tylenol and Motrin. If you have a condition that does not allow you to take Tylenol or Motrin, notify your surgical team.  How will I manage my pain? The best strategy for controlling your pain after surgery is around the clock pain control with Tylenol (acetaminophen) and Motrin (ibuprofen or Advil). Alternating these medications with each other allows you to maximize your pain control. In addition to Tylenol and Motrin, you can use heating pads or ice packs on your incisions to help reduce your pain.  How will I alternate your regular strength over-the-counter pain medication? You will take a dose of pain medication every three hours. Start by taking 650 mg of Tylenol (2 pills of 325 mg) 3 hours later take 600 mg of Motrin (3 pills of 200 mg) 3 hours after taking the Motrin take 650 mg of Tylenol 3 hours after that take 600 mg of Motrin.   - 1 -  See example - if your first dose of Tylenol is at 12:00  PM   12:00 PM Tylenol 650 mg (2 pills of 325 mg)  3:00 PM Motrin 600 mg (3 pills of 200 mg)  6:00 PM Tylenol 650 mg (2 pills of 325 mg)  9:00 PM Motrin 600 mg (3 pills of 200 mg)  Continue alternating every 3 hours   We recommend that you follow this schedule around-the-clock for at least 3 days after surgery, or until you feel that it is no longer needed. Use the table on the last page of this handout to keep track of the medications you are taking. Important: Do not take more than 3000mg of Tylenol or 3200mg of Motrin in a 24-hour period. Do not take ibuprofen/Motrin if you have a history of bleeding stomach ulcers, severe kidney disease, &/or actively taking a blood thinner  What if I still have pain? If you have pain that is not controlled with the over-the-counter pain medications (Tylenol and Motrin or Advil) you might have what we call "breakthrough" pain. You will receive a prescription for a small amount of an opioid pain medication such as Oxycodone, Tramadol, or Tylenol with Codeine. Use these opioid pills in the first 24 hours after surgery if you have breakthrough pain. Do   not take more than 1 pill every 4-6 hours.  If you still have uncontrolled pain after using all opioid pills, don't hesitate to call our staff using the number provided. We will help make sure you are managing your pain in the best way possible, and if necessary, we can provide a prescription for additional pain medication.   Day 1    Time  Name of Medication Number of pills taken  Amount of Acetaminophen  Pain Level   Comments  AM PM       AM PM       AM PM       AM PM       AM PM       AM PM       AM PM       AM PM       Total Daily amount of Acetaminophen Do not take more than  3,000 mg per day      Day 2    Time  Name of Medication Number of pills taken  Amount of Acetaminophen  Pain Level   Comments  AM PM       AM PM       AM PM       AM PM       AM PM       AM PM       AM  PM       AM PM       Total Daily amount of Acetaminophen Do not take more than  3,000 mg per day      Day 3    Time  Name of Medication Number of pills taken  Amount of Acetaminophen  Pain Level   Comments  AM PM       AM PM       AM PM       AM PM          AM PM       AM PM       AM PM       AM PM       Total Daily amount of Acetaminophen Do not take more than  3,000 mg per day      Day 4    Time  Name of Medication Number of pills taken  Amount of Acetaminophen  Pain Level   Comments  AM PM       AM PM       AM PM       AM PM       AM PM       AM PM       AM PM       AM PM       Total Daily amount of Acetaminophen Do not take more than  3,000 mg per day      Day 5    Time  Name of Medication Number of pills taken  Amount of Acetaminophen  Pain Level   Comments  AM PM       AM PM       AM PM       AM PM       AM PM       AM PM       AM PM       AM PM       Total Daily amount of Acetaminophen Do not take more than    3,000 mg per day       Day 6    Time  Name of Medication Number of pills taken  Amount of Acetaminophen  Pain Level  Comments  AM PM       AM PM       AM PM       AM PM       AM PM       AM PM       AM PM       AM PM       Total Daily amount of Acetaminophen Do not take more than  3,000 mg per day      Day 7    Time  Name of Medication Number of pills taken  Amount of Acetaminophen  Pain Level   Comments  AM PM       AM PM       AM PM       AM PM       AM PM       AM PM       AM PM       AM PM       Total Daily amount of Acetaminophen Do not take more than  3,000 mg per day        For additional information about how and where to safely dispose of unused opioid medications - https://www.morepowerfulnc.org  Disclaimer: This document contains information and/or instructional materials adapted from [facility] for the typical patient with your condition. It does not replace medical advice  from your health care provider because your experience may differ from that of the typical patient. Talk to your health care provider if you have any questions about this document, your condition or your treatment plan. Adapted from [facility]  

## 2021-02-23 NOTE — Anesthesia Preprocedure Evaluation (Addendum)
Anesthesia Evaluation  Patient identified by MRN, date of birth, ID band Patient awake    Reviewed: Allergy & Precautions, NPO status , Patient's Chart, lab work & pertinent test results  Airway Mallampati: II  TM Distance: >3 FB Neck ROM: Full    Dental  (+) Teeth Intact Removable retainer :   Pulmonary neg pulmonary ROS,    Pulmonary exam normal        Cardiovascular negative cardio ROS   Rhythm:Regular Rate:Normal     Neuro/Psych negative neurological ROS  negative psych ROS   GI/Hepatic Neg liver ROS, Acute appendicitis    Endo/Other  negative endocrine ROS  Renal/GU negative Renal ROS  negative genitourinary   Musculoskeletal negative musculoskeletal ROS (+)   Abdominal (+)  Abdomen: tender.    Peds  Hematology negative hematology ROS (+)   Anesthesia Other Findings   Reproductive/Obstetrics                            Anesthesia Physical Anesthesia Plan  ASA: 2  Anesthesia Plan: General   Post-op Pain Management:    Induction: Intravenous  PONV Risk Score and Plan: 2 and Ondansetron, Dexamethasone and Treatment may vary due to age or medical condition  Airway Management Planned: Mask and Oral ETT  Additional Equipment: None  Intra-op Plan:   Post-operative Plan: Extubation in OR  Informed Consent: I have reviewed the patients History and Physical, chart, labs and discussed the procedure including the risks, benefits and alternatives for the proposed anesthesia with the patient or authorized representative who has indicated his/her understanding and acceptance.     Dental advisory given  Plan Discussed with: CRNA  Anesthesia Plan Comments: (Lab Results      Component                Value               Date                      WBC                      5.7                 02/23/2021                HGB                      13.3                02/23/2021                 HCT                      40.0                02/23/2021                MCV                      90.7                02/23/2021                PLT                      182  02/23/2021            Lab Results      Component                Value               Date                      NA                       139                 02/23/2021                K                        3.9                 02/23/2021                CO2                      27                  02/23/2021                GLUCOSE                  91                  02/23/2021                BUN                      23 (H)              02/23/2021                CREATININE               1.19                02/23/2021                CALCIUM                  8.9                 02/23/2021                GFRNONAA                 >60                 02/23/2021                GFRAA                    >60                 02/25/2020          )        Anesthesia Quick Evaluation

## 2021-02-23 NOTE — Anesthesia Postprocedure Evaluation (Signed)
Anesthesia Post Note  Patient: LADEN FIELDHOUSE  Procedure(s) Performed: APPENDECTOMY LAPAROSCOPIC (Abdomen)     Patient location during evaluation: PACU Anesthesia Type: General Level of consciousness: awake and alert Pain management: pain level controlled Vital Signs Assessment: post-procedure vital signs reviewed and stable Respiratory status: spontaneous breathing, nonlabored ventilation and respiratory function stable Cardiovascular status: blood pressure returned to baseline and stable Postop Assessment: no apparent nausea or vomiting Anesthetic complications: no   No notable events documented.  Last Vitals:  Vitals:   02/23/21 1600 02/23/21 1610  BP: 121/72 114/70  Pulse: 82 89  Resp: 14 18  Temp: 36.4 C   SpO2: 99% 100%    Last Pain:  Vitals:   02/23/21 1610  TempSrc:   PainSc: 5                  Beryle Lathe

## 2021-02-23 NOTE — ED Provider Notes (Signed)
[facility] EMERGENCY DEPARTMENT Provider Note   CSN: [id number] Arrival date & time: 02/23/21  2297     History Chief Complaint  Patient presents with   Abdominal Pain    Cameron Cox is a 35 y.o. male.  HPI 35 year old male presents with right-sided abdominal pain.  On 9/16 he had numerous episodes of vomiting.  At that time he had some chest/upper abdominal pain.  Never had a fever though his temp was up to 99.  He has had a little bit of constipation since.  However starting the next day he has had persistent, constant but mild to moderate pain in the right side of his abdomen.  Sometimes laying on his left side makes a little worse and laying on the right side makes a little better.  Otherwise no significant change.  Eating does not make it worse.  No dysuria or hematuria but his urine has been a little darker.  No back pain.  Pain is rated about a 3.  History reviewed. No pertinent past medical history.  There are no problems to display for this patient.   History reviewed. No pertinent surgical history.     Family History  Problem Relation Age of Onset   Hypertension Mother     Social History   Tobacco Use   Smoking status: Never   Smokeless tobacco: Never  Vaping Use   Vaping Use: Never used  Substance Use Topics   Alcohol use: Not Currently   Drug use: Never    Home Medications Prior to Admission medications   Medication Sig Start Date End Date Taking? Authorizing Provider  benzonatate (TESSALON) 100 MG capsule Take 1 capsule (100 mg total) by mouth 3 (three) times daily as needed for cough. 02/25/20   Linwood Dibbles, MD  ondansetron (ZOFRAN ODT) 8 MG disintegrating tablet Take 1 tablet (8 mg total) by mouth every 8 (eight) hours as needed for nausea or vomiting. 02/25/20   Linwood Dibbles, MD    Allergies    Patient has no known allergies.  Review of Systems   Review of Systems  Constitutional:  Negative for fever.  Gastrointestinal:  Positive for  abdominal pain and vomiting. Negative for diarrhea.  Genitourinary:  Negative for dysuria and hematuria.  Musculoskeletal:  Negative for back pain.  All other systems reviewed and are negative.  Physical Exam Updated Vital Signs BP (!) 123/96 (BP Location: Right Arm)   Pulse 74   Temp 98 F (36.7 C) (Oral)   Resp 16   Ht 5\' 7"  (1.702 m)   Wt 80.3 kg   SpO2 100%   BMI 27.72 kg/m   Physical Exam Vitals and nursing note reviewed.  Constitutional:      General: He is not in acute distress.    Appearance: He is well-developed. He is not ill-appearing or diaphoretic.  HENT:     Head: Normocephalic and atraumatic.     Right Ear: External ear normal.     Left Ear: External ear normal.     Nose: Nose normal.  Eyes:     General:        Right eye: No discharge.        Left eye: No discharge.  Cardiovascular:     Rate and Rhythm: Normal rate and regular rhythm.     Heart sounds: Normal heart sounds.  Pulmonary:     Effort: Pulmonary effort is normal.     Breath sounds: Normal breath sounds.  Abdominal:  Palpations: Abdomen is soft.     Tenderness: There is abdominal tenderness in the right lower quadrant. There is no right CVA tenderness or left CVA tenderness.  Musculoskeletal:     Cervical back: Neck supple.  Skin:    General: Skin is warm and dry.  Neurological:     Mental Status: He is alert.  Psychiatric:        Mood and Affect: Mood is not anxious.    ED Results / Procedures / Treatments   Labs (all labs ordered are listed, but only abnormal results are displayed) Labs Reviewed  URINALYSIS, ROUTINE W REFLEX MICROSCOPIC - Abnormal; Notable for the following components:      Result Value   Hgb urine dipstick MODERATE (*)    All other components within normal limits  URINALYSIS, MICROSCOPIC (REFLEX) - Abnormal; Notable for the following components:   Bacteria, UA RARE (*)    All other components within normal limits  COMPREHENSIVE METABOLIC PANEL - Abnormal;  Notable for the following components:   BUN 23 (*)    All other components within normal limits  RESP PANEL BY RT-PCR (FLU A&B, COVID) ARPGX2  CBC WITH DIFFERENTIAL/PLATELET  LIPASE, BLOOD  CK    EKG None  Radiology CT ABDOMEN PELVIS W CONTRAST  Result Date: 02/23/2021 CLINICAL DATA:  Right lower quadrant abdominal pain. Concern for appendicitis. EXAM: CT ABDOMEN AND PELVIS WITH CONTRAST TECHNIQUE: Multidetector CT imaging of the abdomen and pelvis was performed using the standard protocol following bolus administration of intravenous contrast. CONTRAST:  63mL OMNIPAQUE IOHEXOL 350 MG/ML SOLN COMPARISON:  None. FINDINGS: Lower chest: Limited visualization of the lower thorax is negative for focal airspace opacity or pleural effusion. Normal heart size.  No pericardial effusion. Hepatobiliary: Normal hepatic contour. No discrete hepatic lesions. Normal appearance of the gallbladder given degree distention. No radiopaque gallstones. No intra or extrahepatic biliary duct dilatation. No ascites. Pancreas: Normal appearance of the pancreas. Spleen: Normal appearance of the spleen. Adrenals/Urinary Tract: There is symmetric enhancement of the bilateral kidneys. No definite evidence of nephrolithiasis on this postcontrast examination. There is a punctate (approximately 3 mm) hypoattenuating lesion within the posterior interpolar aspect the right kidney (image 29, series 2), which is too small to adequately characterize though favored to represent a renal cyst. No discrete left-sided renal lesions. Normal appearance of the bilateral adrenal glands Normal appearance of the urinary bladder given degree distention. Stomach/Bowel: The appendix is mildly dilated (measuring 1 cm in greatest oblique short axis coronal diameter-coronal image 44, series 5) with associated minimal wall thickening and periappendiceal stranding. No definitive at appendicolith is identified. No evidence of perforation or  definable/drainable fluid collection. Moderate colonic stool burden without evidence of enteric obstruction. Small hiatal hernia. No pneumoperitoneum, pneumatosis or portal venous gas. Vascular/Lymphatic: Normal caliber of the abdominal aorta. The major branch vessels of the abdominal aorta appear patent on this non CTA examination. Scattered mildly prominent lymph nodes centered within the right lower abdominal quadrant are numerous though individually not enlarged by size criteria with index right lower quadrant mesenteric lymph node measuring 0.5 cm in greatest short axis diameter (image 37, series 2, presumably reactive in etiology. No bulky retroperitoneal, mesenteric, pelvic or inguinal lymphadenopathy. Reproductive: Normal appearance the prostate gland. No free fluid the pelvic cul-de-sac. Other: Regional soft tissues appear normal. Musculoskeletal: No acute or aggressive osseous abnormalities. There is partial lumbarization of S1. IMPRESSION: Examination is positive for acute uncomplicated appendicitis without evidence of an appendicolith, perforation or definable/drainable fluid collection. Critical  Value/emergent results were called by telephone at the time of interpretation on 02/23/2021 at 8:35 am to provider Pricilla Loveless , who verbally acknowledged these results. Electronically Signed   By: Simonne Come M.D.   On: 02/23/2021 08:35    Procedures Procedures   Medications Ordered in ED Medications  0.9 %  sodium chloride infusion (500 mLs Intravenous New Bag/Given 02/23/21 0922)  sodium chloride 0.9 % bolus 1,000 mL (0 mLs Intravenous Stopped 02/23/21 0921)  iohexol (OMNIPAQUE) 350 MG/ML injection 100 mL (85 mLs Intravenous Contrast Given 02/23/21 0801)  cefTRIAXone (ROCEPHIN) 2 g in sodium chloride 0.9 % 100 mL IVPB (0 g Intravenous Stopped 02/23/21 0921)    And  metroNIDAZOLE (FLAGYL) IVPB 500 mg (0 mg Intravenous Stopped 02/23/21 1031)    ED Course  I have reviewed the triage vital signs and  the nursing notes.  Pertinent labs & imaging results that were available during my care of the patient were reviewed by me and considered in my medical decision making (see chart for details).    MDM Rules/Calculators/A&P                           CT scan shows acute appendicitis. I have personally reviewed these images. He was given rocephin/flagyl. Declines pain meds. Discussed with Dr. Freida Busman, will transfer to [facility] pre-op for appendectomy. Final Clinical Impression(s) / ED Diagnoses Final diagnoses:  Acute appendicitis with localized peritonitis, without perforation, abscess, or gangrene    Rx / DC Orders ED Discharge Orders     None        Pricilla Loveless, MD 02/23/21 1122

## 2021-02-24 ENCOUNTER — Encounter (HOSPITAL_COMMUNITY): Payer: Self-pay | Admitting: Surgery

## 2021-02-26 LAB — SURGICAL PATHOLOGY

## 2021-09-29 IMAGING — CT CT ABD-PELV W/ CM
2 of 4 series · 15 of 46 positions shown, 17 images · IV contrast (Omnipaque)
Comparison: None.

CLINICAL DATA: Right lower quadrant abdominal pain. Concern for
appendicitis.

EXAM:
CT ABDOMEN AND PELVIS WITH CONTRAST
TECHNIQUE: Multidetector CT imaging of the abdomen and pelvis was performed
using the standard protocol following bolus administration of
intravenous contrast.
CONTRAST:  85mL OMNIPAQUE IOHEXOL 350 MG/ML SOLN

[Series 2: axial st · axial · 0.90mm/px · z∈[+763,+1228]mm · 12 of 103 slices shown, 14 images]
[im 5/103  soft-tissue]
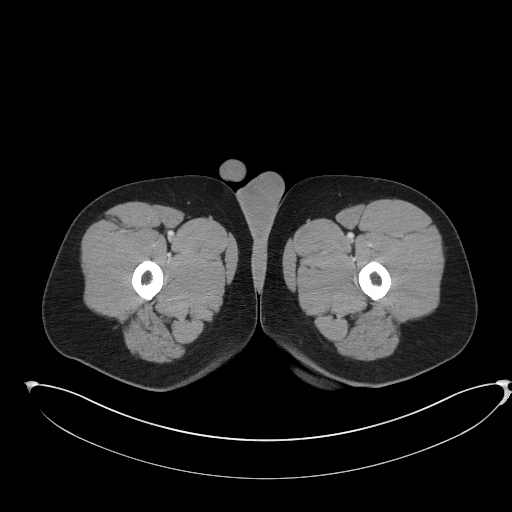
[im 5/103  bone]
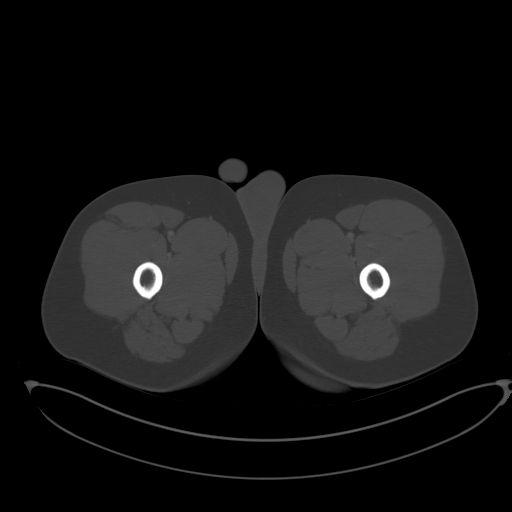
[im 13/103  soft-tissue]
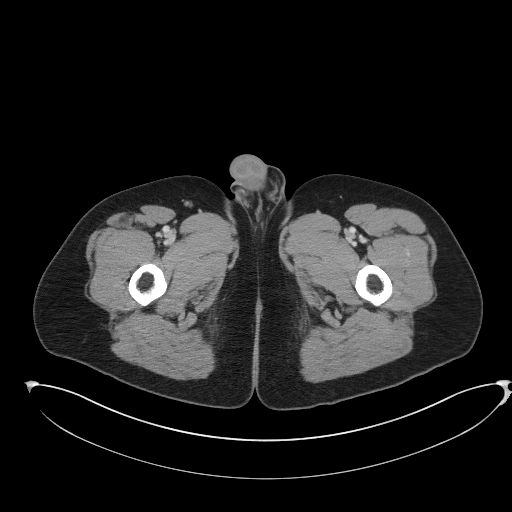
[im 22/103  soft-tissue]
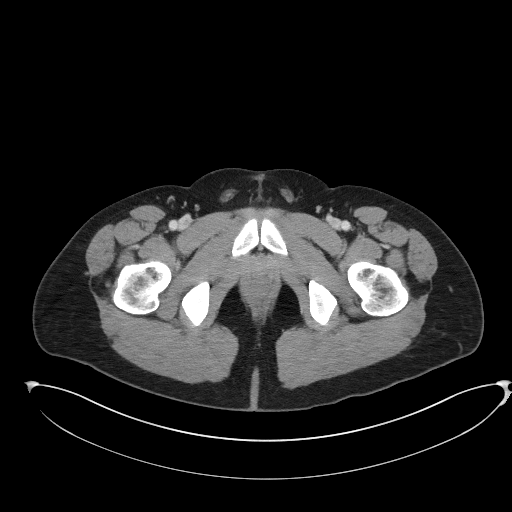
[im 30/103  soft-tissue]
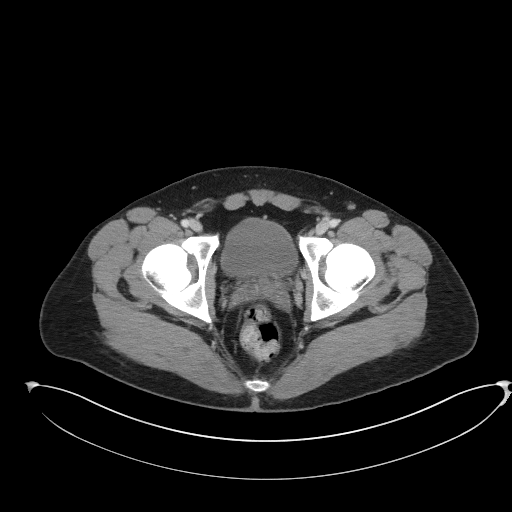
[im 39/103  soft-tissue]
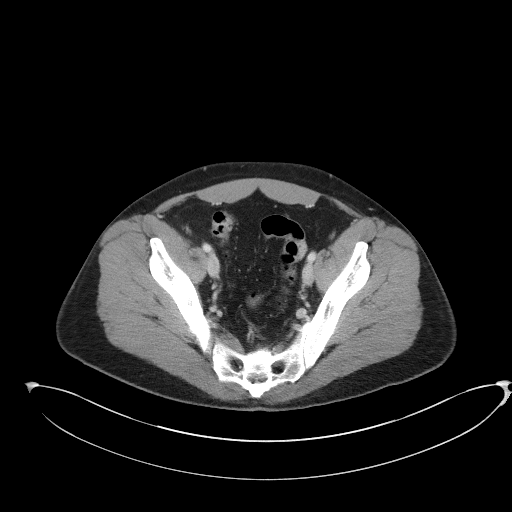
[im 47/103  soft-tissue]
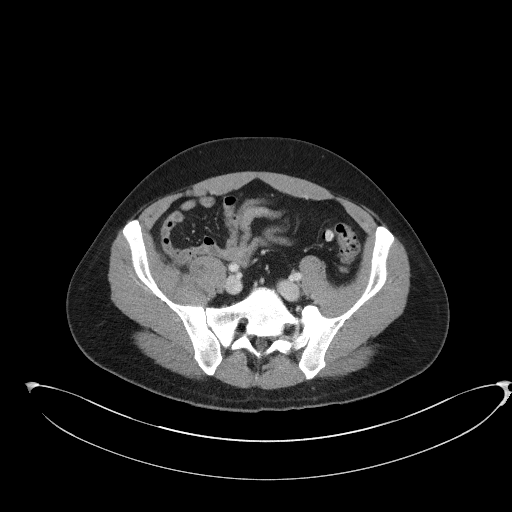
[im 56/103  soft-tissue]
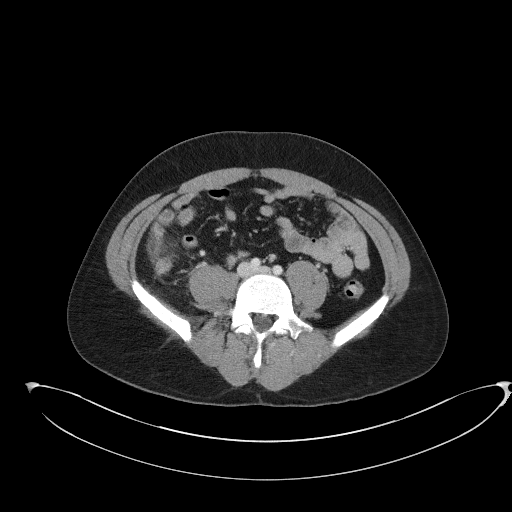
[im 64/103  soft-tissue]
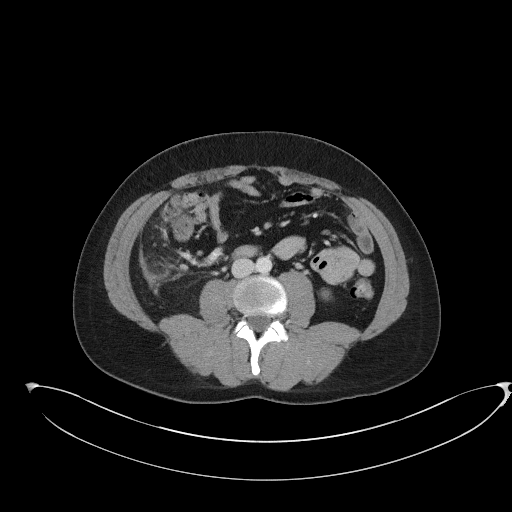
[im 73/103  soft-tissue]
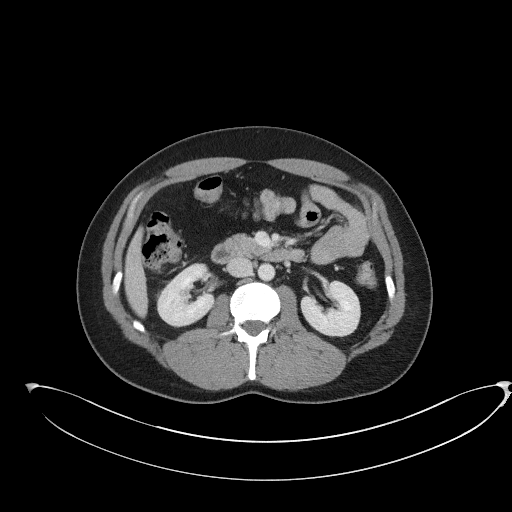
[im 73/103  bone]
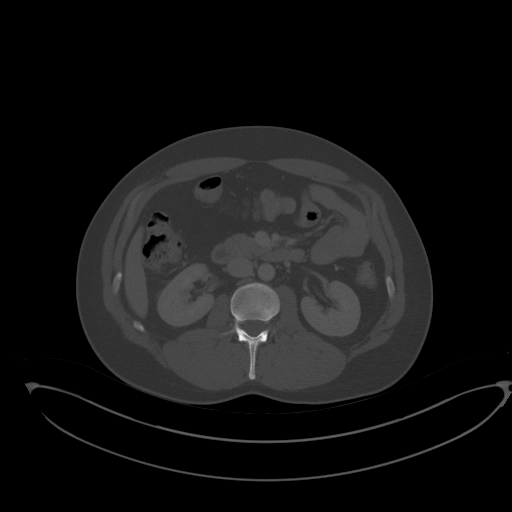
[im 81/103  soft-tissue]
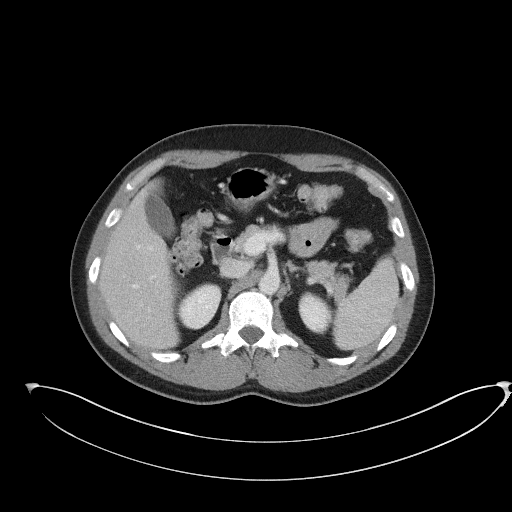
[im 90/103  soft-tissue]
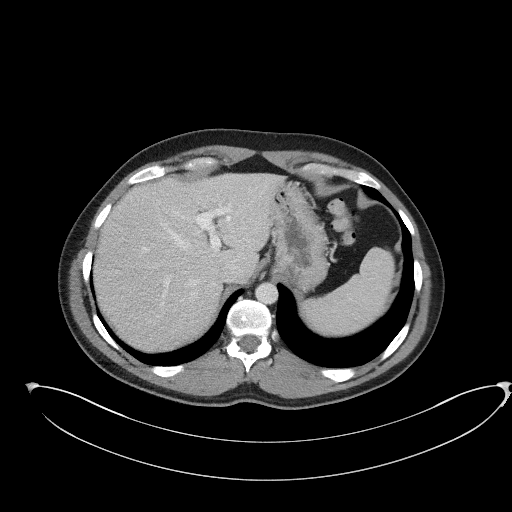
[im 98/103  soft-tissue]
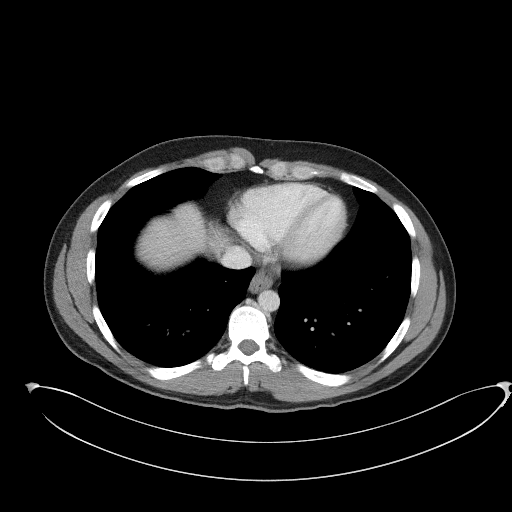

[Series 5: coronal st · coronal · 0.79mm/px · 3 of 89 slices shown]
[im 30/89  soft-tissue]
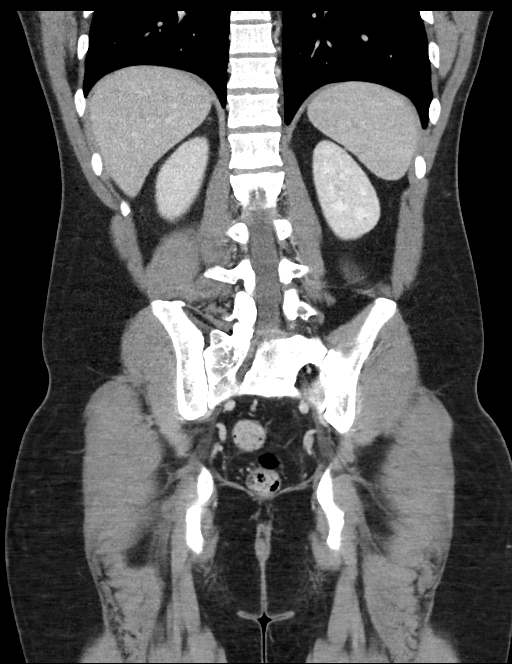
[im 40/89  soft-tissue]
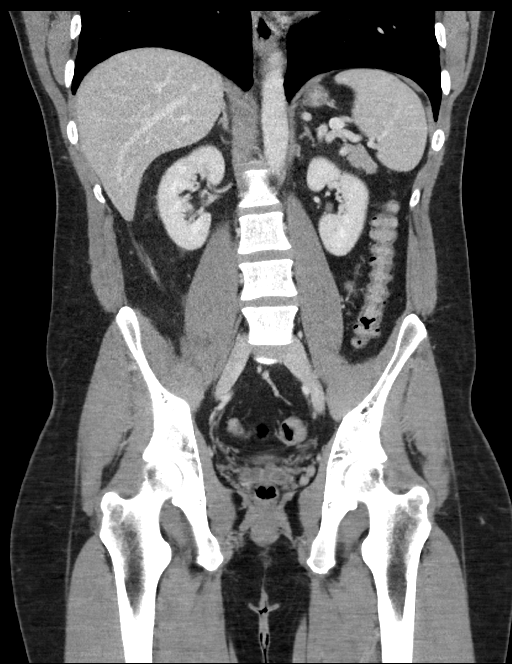
[im 49/89  soft-tissue]
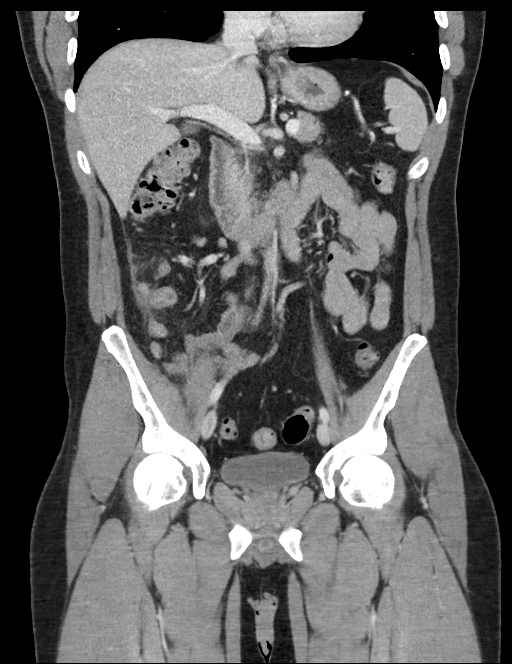

[15 of 46 positions shown; findings below may reference images not displayed]

FINDINGS: Lower chest: Limited visualization of the lower thorax is negative
for focal airspace opacity or pleural effusion.

Normal heart size.  No pericardial effusion.

Hepatobiliary: Normal hepatic contour. No discrete hepatic lesions.
Normal appearance of the gallbladder given degree distention. No
radiopaque gallstones. No intra or extrahepatic biliary duct
dilatation. No ascites.

Pancreas: Normal appearance of the pancreas.

Spleen: Normal appearance of the spleen.

Adrenals/Urinary Tract: There is symmetric enhancement of the
bilateral kidneys. No definite evidence of nephrolithiasis on this
postcontrast examination. There is a punctate (approximately 3 mm)
hypoattenuating lesion within the posterior interpolar aspect the
right kidney (image 29, series 2), which is too small to adequately
characterize though favored to represent a renal cyst. No discrete
left-sided renal lesions. Normal appearance of the bilateral adrenal
glands

Normal appearance of the urinary bladder given degree distention.

Stomach/Bowel: The appendix is mildly dilated (measuring 1 cm in
greatest oblique short axis coronal diameter-coronal image 44,
series 5) with associated minimal wall thickening and
periappendiceal stranding. No definitive at appendicolith is
identified. No evidence of perforation or definable/drainable fluid
collection.

Moderate colonic stool burden without evidence of enteric
obstruction. Small hiatal hernia. No pneumoperitoneum, pneumatosis
or portal venous gas.

Vascular/Lymphatic: Normal caliber of the abdominal aorta. The major
branch vessels of the abdominal aorta appear patent on this non CTA
examination.

Scattered mildly prominent lymph nodes centered within the right
lower abdominal quadrant are numerous though individually not
enlarged by size criteria with index right lower quadrant mesenteric
lymph node measuring 0.5 cm in greatest short axis diameter (image
37, series 2, presumably reactive in etiology. No bulky
retroperitoneal, mesenteric, pelvic or inguinal lymphadenopathy.

Reproductive: Normal appearance the prostate gland. No free fluid
the pelvic cul-de-sac.

Other: Regional soft tissues appear normal.

Musculoskeletal: No acute or aggressive osseous abnormalities. There
is partial lumbarization of S1.
IMPRESSION: Examination is positive for acute uncomplicated appendicitis without
evidence of an appendicolith, perforation or definable/drainable
fluid collection.

Critical Value/emergent results were called by telephone at the time
of interpretation on 02/23/2021 at [DATE] to provider OLLRICK MONROES
, who verbally acknowledged these results.

## 2022-06-29 ENCOUNTER — Other Ambulatory Visit: Payer: Self-pay

## 2022-06-29 ENCOUNTER — Encounter: Payer: Self-pay | Admitting: Family Medicine

## 2022-06-29 ENCOUNTER — Ambulatory Visit: Payer: 59 | Admitting: Family Medicine

## 2022-06-29 ENCOUNTER — Other Ambulatory Visit (INDEPENDENT_AMBULATORY_CARE_PROVIDER_SITE_OTHER): Payer: 59

## 2022-06-29 VITALS — BP 128/80 | HR 94 | Temp 98.0°F | Resp 16 | Ht 67.0 in | Wt 192.6 lb

## 2022-06-29 DIAGNOSIS — Z1159 Encounter for screening for other viral diseases: Secondary | ICD-10-CM | POA: Diagnosis not present

## 2022-06-29 DIAGNOSIS — Z114 Encounter for screening for human immunodeficiency virus [HIV]: Secondary | ICD-10-CM

## 2022-06-29 DIAGNOSIS — Z0000 Encounter for general adult medical examination without abnormal findings: Secondary | ICD-10-CM | POA: Diagnosis not present

## 2022-06-29 DIAGNOSIS — Z23 Encounter for immunization: Secondary | ICD-10-CM

## 2022-06-29 NOTE — Patient Instructions (Signed)
Give us 2-3 business days to get the results of your labs back.   Keep the diet clean and stay active.  Do monthly self testicular checks in the shower. You are feeling for lumps/bumps that don't belong. If you feel anything like this, let me know!  Please get me a copy of your advanced directive form at your convenience.   Let us know if you need anything.  

## 2022-06-29 NOTE — Progress Notes (Signed)
Chief Complaint  Patient presents with   [place]    Well Male Cameron Cox is here for a complete physical.   His last physical was >1 year ago.  Current diet: in general, a "healthy" diet.   Current exercise: walking, cycling Weight trend: gained a little Fatigue out of ordinary? No. Seat belt? Yes.   Advanced directive? No  Health maintenance Tetanus- Due HIV- No Hep C- No  Past Medical History:  Diagnosis Date   No known health problems      Past Surgical History:  Procedure Laterality Date   LAPAROSCOPIC APPENDECTOMY N/A 02/23/2021   Procedure: APPENDECTOMY LAPAROSCOPIC;  Surgeon: Dwan Bolt, MD;  Location: [facility];  Service: General;  Laterality: N/A;    Medications  Takes no meds routinely.    Allergies No Known Allergies  Family History Family History  Problem Relation Age of Onset   Hypertension Mother    Heart disease Mother    High blood pressure Father    High Cholesterol Brother    Cancer Neg Hx     Review of Systems: Constitutional: no fevers or chills Eye:  no recent significant change in vision Ear/Nose/Mouth/Throat:  Ears:  no hearing loss Nose/Mouth/Throat:  no complaints of nasal congestion, no sore throat Cardiovascular:  no chest pain Respiratory:  no shortness of breath Gastrointestinal:  no abdominal pain, no change in bowel habits GU:  Male: negative for dysuria Musculoskeletal/Extremities:  no pain of the joints Integumentary (Skin/Breast):  no abnormal skin lesions reported Neurologic:  no headaches Endocrine: No unexpected weight changes Hematologic/Lymphatic:  no night sweats  Exam BP 128/80 (BP Location: Right Arm, Patient Position: Sitting, Cuff Size: Normal)   Pulse 94   Temp 98 F (36.7 C) (Oral)   Resp 16   Ht 5\' 7"  (1.702 m)   Wt 192 lb 9.6 oz (87.4 kg)   SpO2 100%   BMI 30.17 kg/m  General:  well developed, well nourished, in no apparent distress Skin:  no significant moles,  warts, or growths Head:  no masses, lesions, or tenderness Eyes:  pupils equal and round, sclera anicteric without injection Ears:  canals without lesions, TMs shiny without retraction, no obvious effusion, no erythema Nose:  nares patent, mucosa normal Throat/Pharynx:  lips and gingiva without lesion; tongue and uvula midline; non-inflamed pharynx; no exudates or postnasal drainage Neck: neck supple without adenopathy, thyromegaly, or masses Lungs:  clear to auscultation, breath sounds equal bilaterally, no respiratory distress Cardio:  regular rate and rhythm, no bruits, no LE edema Abdomen:  abdomen soft, nontender; bowel sounds normal; no masses or organomegaly Genital (male): Deferred Rectal: Deferred Musculoskeletal:  symmetrical muscle groups noted without atrophy or deformity Extremities:  no clubbing, cyanosis, or edema, no deformities, no skin discoloration Neuro:  gait normal; deep tendon reflexes normal and symmetric Psych: well oriented with normal range of affect and appropriate judgment/insight  Assessment and Plan  Well adult exam - Plan: CBC, Comprehensive metabolic panel, Lipid panel  Screening for HIV without presence of risk factors - Plan: HIV Antibody (routine testing w rflx)  Encounter for hepatitis C screening test for low risk patient - Plan: Hepatitis C antibody   Well 37 y.o. male. Counseled on diet and exercise. Self testicular exams recommended at least monthly.  Advanced directive form provided today.  Tdap today. Flu shot politely declined.  Other orders as above. Follow up in 1 year pending the above workup. The patient voiced understanding  and agreement to the plan.  [place], DO 06/29/22 1:48 PM

## 2022-06-30 LAB — COMPREHENSIVE METABOLIC PANEL
ALT: 29 U/L (ref 0–53)
AST: 25 U/L (ref 0–37)
Albumin: 4.7 g/dL (ref 3.5–5.2)
Alkaline Phosphatase: 78 U/L (ref 39–117)
BUN: 23 mg/dL (ref 6–23)
CO2: 30 mEq/L (ref 19–32)
Calcium: 9.3 mg/dL (ref 8.4–10.5)
Chloride: 102 mEq/L (ref 96–112)
Creatinine, Ser: 1.44 mg/dL (ref 0.40–1.50)
GFR: 63.2 mL/min (ref >60.00)
Glucose, Bld: 78 mg/dL (ref 70–99)
Potassium: 4.6 mEq/L (ref 3.5–5.1)
Sodium: 141 mEq/L (ref 135–145)
Total Bilirubin: 0.4 mg/dL (ref 0.2–1.2)
Total Protein: 7.3 g/dL (ref 6.0–8.3)

## 2022-06-30 LAB — LDL CHOLESTEROL, DIRECT: Direct LDL: 126 mg/dL

## 2022-06-30 LAB — CBC
HCT: 41.8 % (ref 39.0–52.0)
Hemoglobin: 14.4 g/dL (ref 13.0–17.0)
MCHC: 34.4 g/dL (ref 30.0–36.0)
MCV: 89.9 fl (ref 78.0–100.0)
Platelets: 201 10*3/uL (ref 150.0–400.0)
RBC: 4.65 Mil/uL (ref 4.22–5.81)
RDW: 12.8 % (ref 11.5–15.5)
WBC: 7.7 10*3/uL (ref 4.0–10.5)

## 2022-06-30 LAB — LIPID PANEL
Cholesterol: 200 mg/dL (ref 0–200)
HDL: 47.1 mg/dL (ref >39.00)
NonHDL: 152.46
Total CHOL/HDL Ratio: 4
Triglycerides: 292 mg/dL — ABNORMAL HIGH (ref 0.0–149.0)
VLDL: 58.4 mg/dL — ABNORMAL HIGH (ref 0.0–40.0)

## 2022-06-30 LAB — HIV ANTIBODY (ROUTINE TESTING W REFLEX): HIV 1&2 Ab, 4th Generation: NONREACTIVE

## 2022-06-30 LAB — HEPATITIS C ANTIBODY: Hepatitis C Ab: NONREACTIVE

## 2022-07-11 ENCOUNTER — Other Ambulatory Visit: Payer: Self-pay | Admitting: Family Medicine

## 2022-07-11 ENCOUNTER — Telehealth: Payer: Self-pay | Admitting: Family Medicine

## 2022-07-11 DIAGNOSIS — E785 Hyperlipidemia, unspecified: Secondary | ICD-10-CM

## 2022-07-11 NOTE — Telephone Encounter (Signed)
Put in orders

## 2022-07-11 NOTE — Telephone Encounter (Signed)
Patient received a letter to schedule lab appt in 6 weeks but lab order not placed.Appt has been scheduled.Please place order for cholesterol recheck with lab.

## 2022-08-22 ENCOUNTER — Other Ambulatory Visit (INDEPENDENT_AMBULATORY_CARE_PROVIDER_SITE_OTHER): Payer: 59

## 2022-08-22 DIAGNOSIS — E785 Hyperlipidemia, unspecified: Secondary | ICD-10-CM

## 2022-08-22 LAB — LIPID PANEL
Cholesterol: 191 mg/dL (ref 0–200)
HDL: 48.3 mg/dL (ref >39.00)
LDL Cholesterol: 104 mg/dL — ABNORMAL HIGH (ref 0–99)
NonHDL: 142.33
Total CHOL/HDL Ratio: 4
Triglycerides: 191 mg/dL — ABNORMAL HIGH (ref 0.0–149.0)
VLDL: 38.2 mg/dL (ref 0.0–40.0)
